# Patient Record
Sex: Male | Born: 1970 | Race: White | Hispanic: No | Marital: Single | Smoking: Never smoker
Health system: Southern US, Community
[De-identification: ages and names within clinical notes are randomized; demographics above are authoritative.]

## PROBLEM LIST (undated history)

## (undated) DIAGNOSIS — S069XAA Unspecified intracranial injury with loss of consciousness status unknown, initial encounter: Secondary | ICD-10-CM

## (undated) DIAGNOSIS — E785 Hyperlipidemia, unspecified: Secondary | ICD-10-CM

## (undated) DIAGNOSIS — S069X9A Unspecified intracranial injury with loss of consciousness of unspecified duration, initial encounter: Secondary | ICD-10-CM

## (undated) HISTORY — PX: GASTROSTOMY TUBE REVISION: SHX1704

## (undated) HISTORY — PX: GASTROSTOMY TUBE PLACEMENT: SHX655

## (undated) HISTORY — PX: BRAIN SURGERY: SHX531

## (undated) HISTORY — PX: WRIST SURGERY: SHX841

## (undated) HISTORY — PX: OTHER SURGICAL HISTORY: SHX169

## (undated) HISTORY — DX: Hyperlipidemia, unspecified: E78.5

---

## 1997-07-06 ENCOUNTER — Inpatient Hospital Stay (HOSPITAL_COMMUNITY): Admission: EM | Admit: 1997-07-06 | Discharge: 1997-08-14 | Payer: Self-pay | Admitting: Emergency Medicine

## 1997-08-14 ENCOUNTER — Inpatient Hospital Stay
Admission: RE | Admit: 1997-08-14 | Discharge: 1997-08-30 | Payer: Self-pay | Admitting: Physical Medicine and Rehabilitation

## 1997-08-30 ENCOUNTER — Inpatient Hospital Stay (HOSPITAL_COMMUNITY)
Admission: RE | Admit: 1997-08-30 | Discharge: 1997-09-27 | Payer: Self-pay | Admitting: Physical Medicine and Rehabilitation

## 1997-09-29 ENCOUNTER — Encounter: Admission: RE | Admit: 1997-09-29 | Discharge: 1997-12-28 | Payer: Self-pay

## 1997-10-18 ENCOUNTER — Encounter: Admission: RE | Admit: 1997-10-18 | Discharge: 1998-01-16 | Payer: Self-pay

## 1998-02-06 ENCOUNTER — Encounter: Admission: RE | Admit: 1998-02-06 | Discharge: 1998-05-07 | Payer: Self-pay

## 2012-04-27 ENCOUNTER — Emergency Department (HOSPITAL_COMMUNITY): Payer: Worker's Compensation

## 2012-04-27 ENCOUNTER — Emergency Department (HOSPITAL_COMMUNITY)
Admission: EM | Admit: 2012-04-27 | Discharge: 2012-04-27 | Disposition: A | Discharge status: Home / Self Care | Payer: Worker's Compensation | Attending: Emergency Medicine | Admitting: Emergency Medicine

## 2012-04-27 ENCOUNTER — Encounter (HOSPITAL_COMMUNITY): Payer: Self-pay | Admitting: Emergency Medicine

## 2012-04-27 DIAGNOSIS — S069XAS Unspecified intracranial injury with loss of consciousness status unknown, sequela: Secondary | ICD-10-CM | POA: Insufficient documentation

## 2012-04-27 DIAGNOSIS — Z87820 Personal history of traumatic brain injury: Secondary | ICD-10-CM | POA: Insufficient documentation

## 2012-04-27 DIAGNOSIS — R51 Headache: Secondary | ICD-10-CM | POA: Insufficient documentation

## 2012-04-27 DIAGNOSIS — H539 Unspecified visual disturbance: Secondary | ICD-10-CM | POA: Insufficient documentation

## 2012-04-27 DIAGNOSIS — S069X9S Unspecified intracranial injury with loss of consciousness of unspecified duration, sequela: Secondary | ICD-10-CM | POA: Insufficient documentation

## 2012-04-27 DIAGNOSIS — S0990XA Unspecified injury of head, initial encounter: Secondary | ICD-10-CM

## 2012-04-27 DIAGNOSIS — Y929 Unspecified place or not applicable: Secondary | ICD-10-CM | POA: Insufficient documentation

## 2012-04-27 DIAGNOSIS — Y9389 Activity, other specified: Secondary | ICD-10-CM | POA: Insufficient documentation

## 2012-04-27 DIAGNOSIS — M549 Dorsalgia, unspecified: Secondary | ICD-10-CM | POA: Insufficient documentation

## 2012-04-27 HISTORY — DX: Unspecified intracranial injury with loss of consciousness status unknown, initial encounter: S06.9XAA

## 2012-04-27 HISTORY — DX: Unspecified intracranial injury with loss of consciousness of unspecified duration, initial encounter: S06.9X9A

## 2012-04-27 NOTE — ED Notes (Signed)
States that he fell off the tailgate of a truck at work. States that his low back is somewhat sore. Denies neck pain or LOC.
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
## 2012-04-28 NOTE — ED Provider Notes (Signed)
History/physical exam/procedure(s) were performed by non-physician practitioner and as supervising physician I was immediately available for consultation/collaboration. I have reviewed all notes and am in agreement with care and plan. I personally performed the services described in this documentation, which was scribed in my presence. The recorded information has been reviewed and considered.   Hilario Quarry, MD 04/28/12 1520

## 2014-06-08 ENCOUNTER — Ambulatory Visit
Admission: RE | Admit: 2014-06-08 | Discharge: 2014-06-08 | Disposition: A | Discharge status: Home / Self Care | Payer: BLUE CROSS/BLUE SHIELD | Source: Ambulatory Visit | Attending: Physician Assistant | Admitting: Physician Assistant

## 2014-06-08 ENCOUNTER — Other Ambulatory Visit: Payer: Self-pay | Admitting: Physician Assistant

## 2014-06-08 DIAGNOSIS — R05 Cough: Secondary | ICD-10-CM

## 2014-06-08 DIAGNOSIS — R509 Fever, unspecified: Secondary | ICD-10-CM

## 2014-06-08 DIAGNOSIS — R059 Cough, unspecified: Secondary | ICD-10-CM

## 2014-10-06 IMAGING — CT CT HEAD W/O CM
2 series · 17 of 30 positions shown, 20 images · non-contrast
Comparison: None.

CLINICAL DATA: Fall

CT HEAD WITHOUT CONTRAST
TECHNIQUE: Contiguous axial images were obtained from the base of
the skull through the vertex without contrast.

[Series 2: head w/o · axial · non-contrast · 0.46mm/px · z∈[-125,-5]mm · 9 of 32 slices shown, 12 images]
[im 4/32  brain]
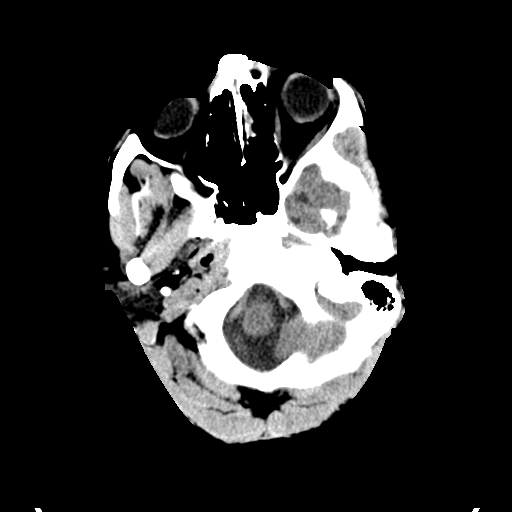
[im 4/32  bone]
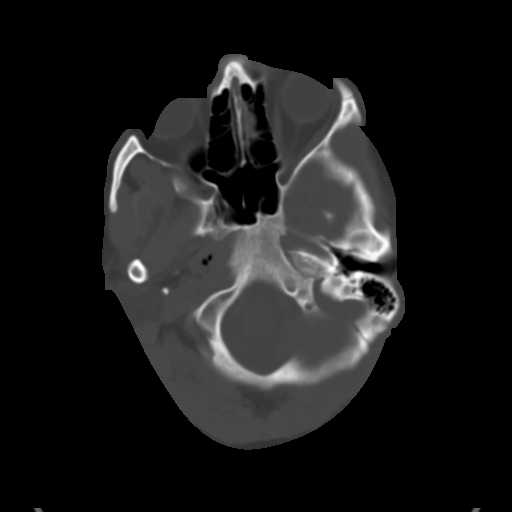
[im 7/32  brain]
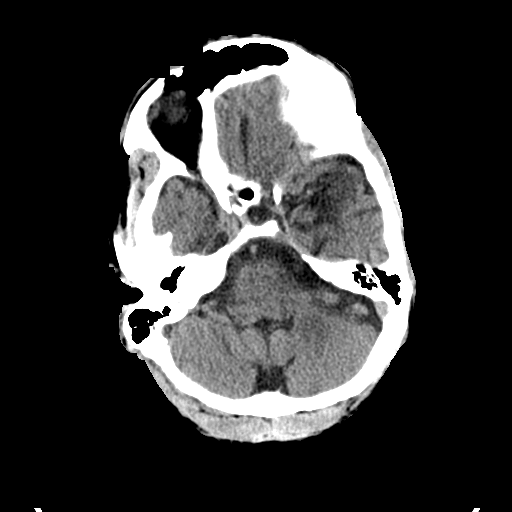
[im 10/32  brain]
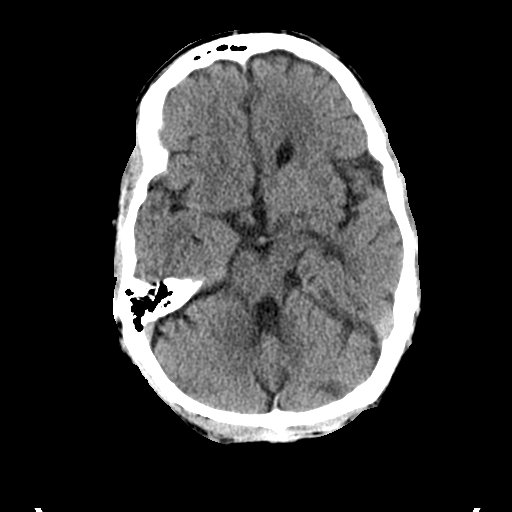
[im 13/32  brain]
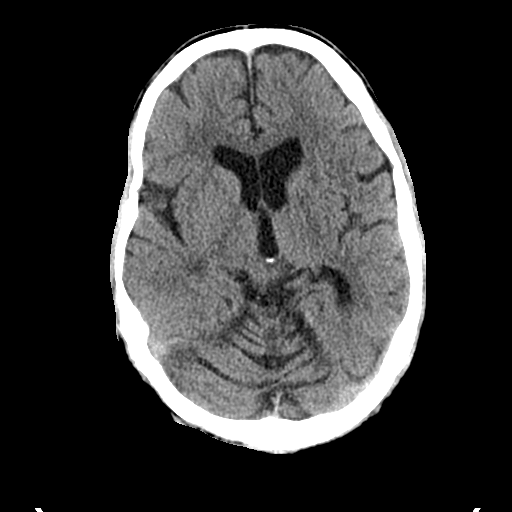
[im 16/32  brain]
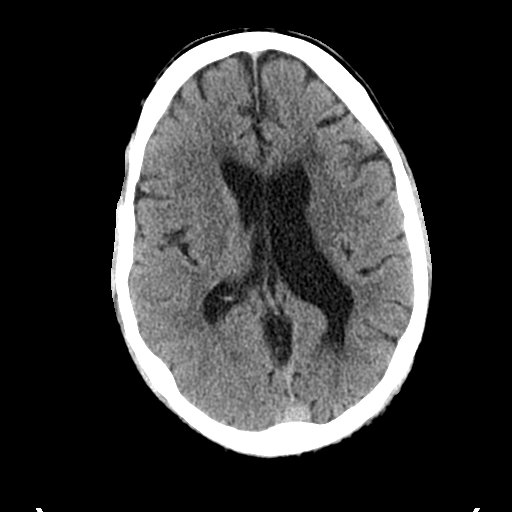
[im 16/32  bone]
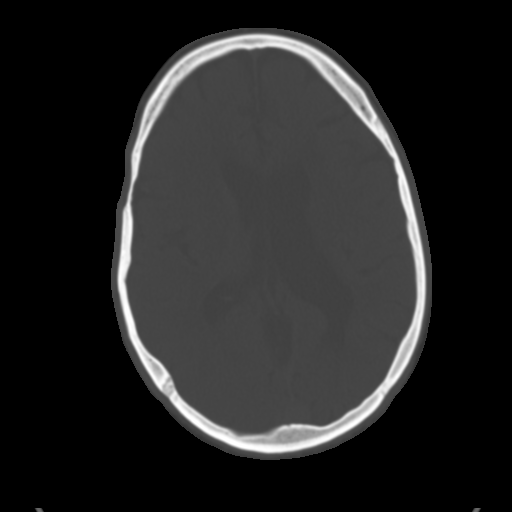
[im 19/32  brain]
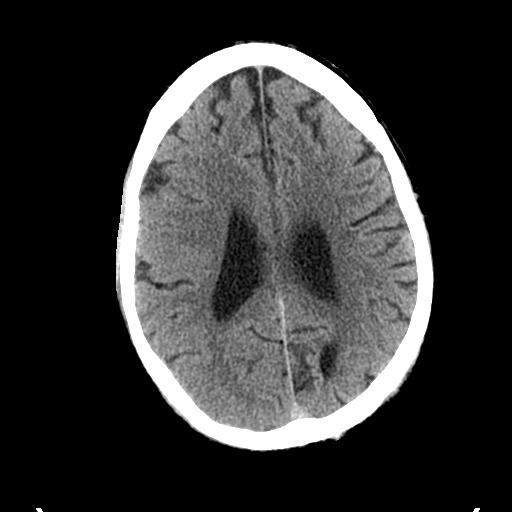
[im 22/32  brain]
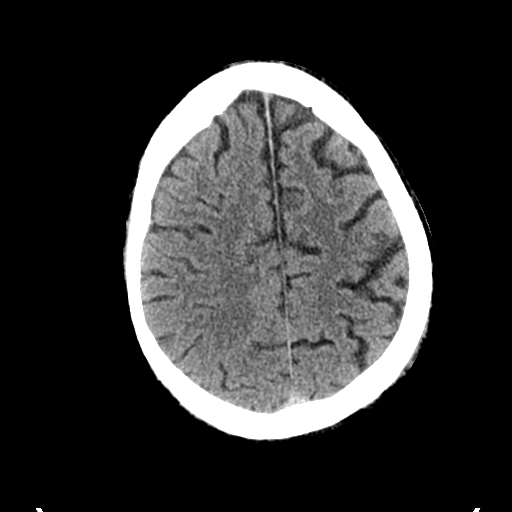
[im 25/32  brain]
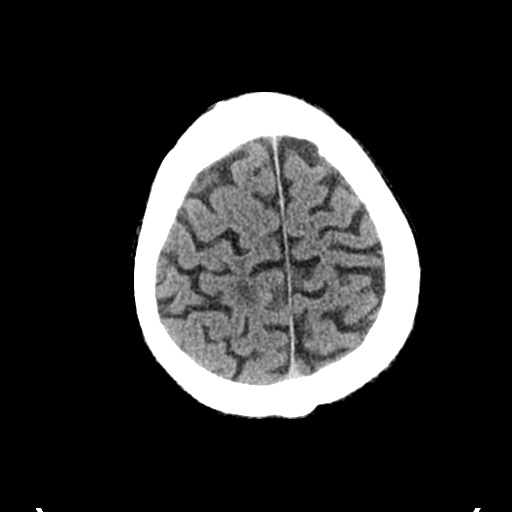
[im 28/32  brain]
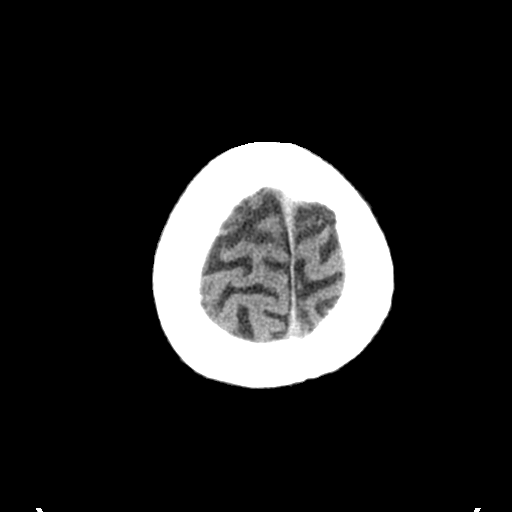
[im 28/32  bone]
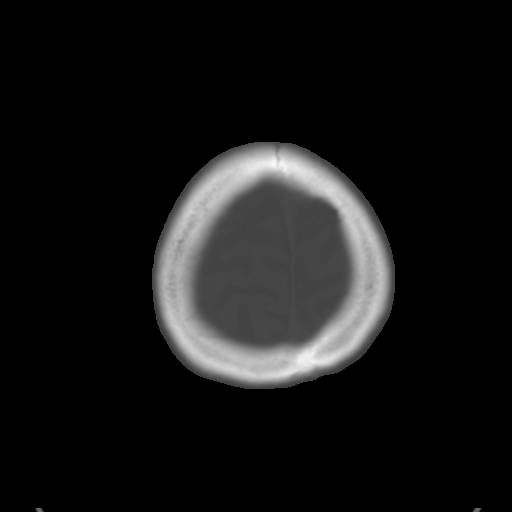

[Series 3: bone windows · axial · 0.46mm/px · z∈[-125,-5]mm · 8 of 52 slices shown]
[im 6/52  bone]
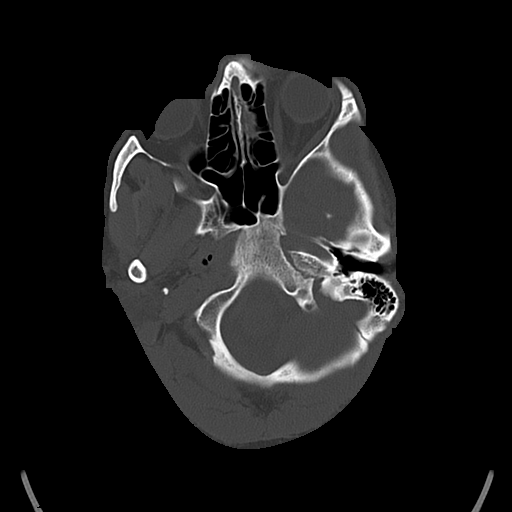
[im 12/52  bone]
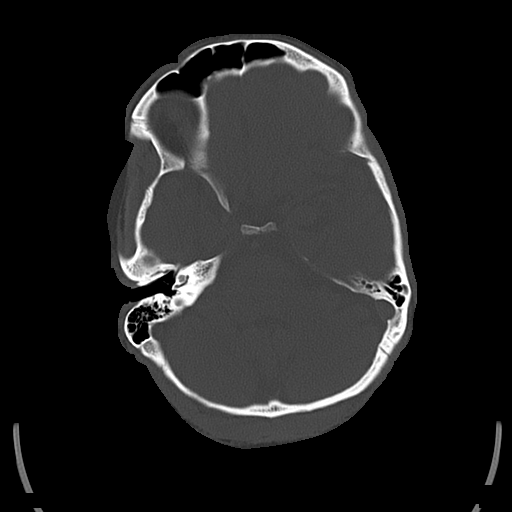
[im 18/52  bone]
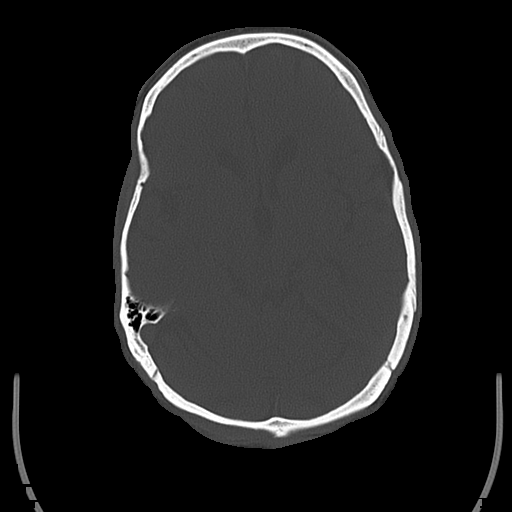
[im 23/52  bone]
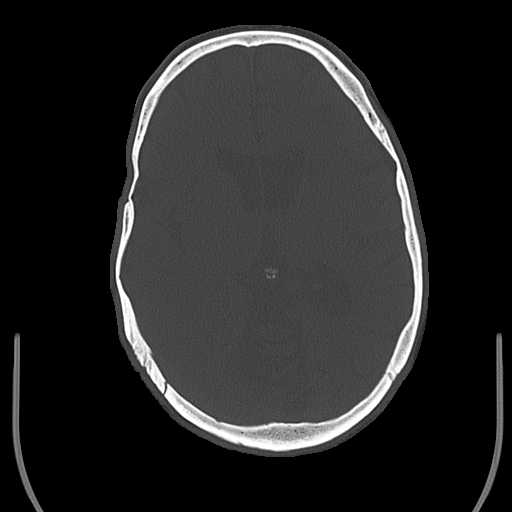
[im 29/52  bone]
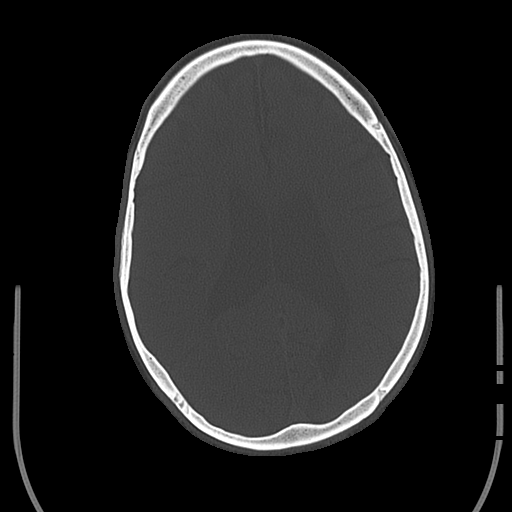
[im 35/52  bone]
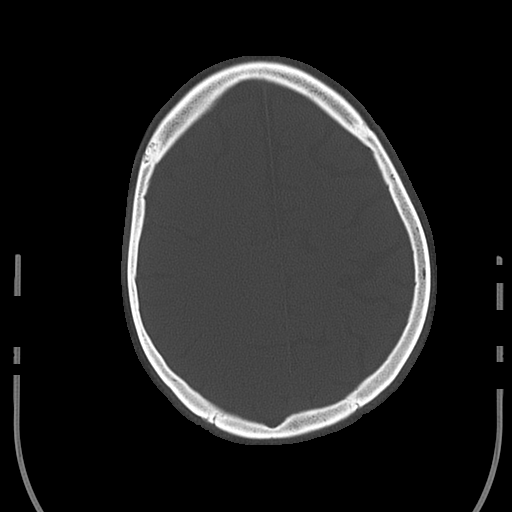
[im 40/52  bone]
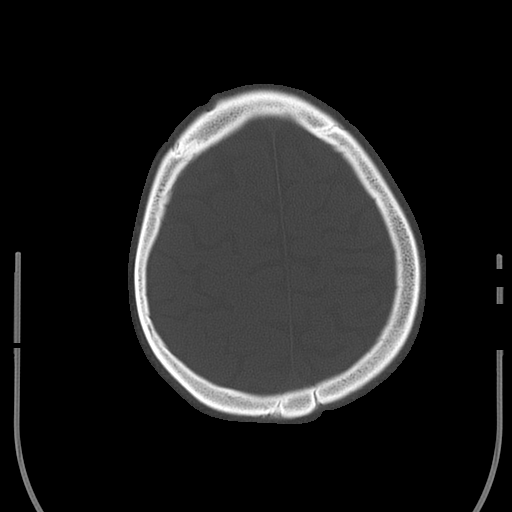
[im 46/52  bone]
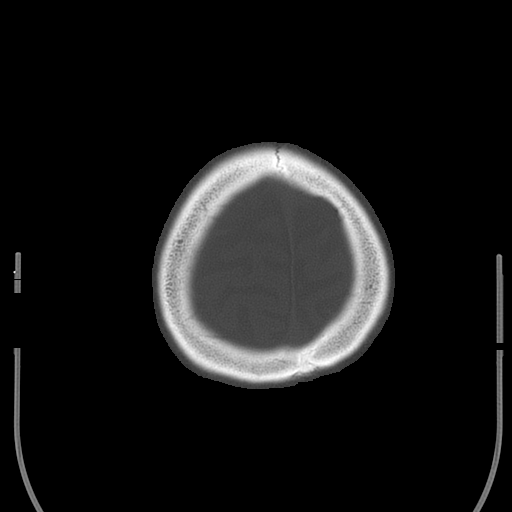

[17 of 30 positions shown; findings below may reference images not displayed]

FINDINGS: There is no mass effect, midline shift, or acute
intracranial hemorrhage.  Hypodensity in the periventricular white
matter of the frontal lobes is present.  Frontal lobe atrophy is
noted. Encephalomalacia in the anterior left upper lobe is likely
related to post-traumatic changes.  Cerebellar atrophy is present.
There is white matter atrophy in the left occipital lobe compared
with that of the right.
IMPRESSION: No acute intracranial pathology.  Chronic changes are noted.

## 2015-10-24 DIAGNOSIS — Z0001 Encounter for general adult medical examination with abnormal findings: Secondary | ICD-10-CM | POA: Diagnosis not present

## 2015-10-24 DIAGNOSIS — E7800 Pure hypercholesterolemia, unspecified: Secondary | ICD-10-CM | POA: Diagnosis not present

## 2016-01-01 DIAGNOSIS — E7800 Pure hypercholesterolemia, unspecified: Secondary | ICD-10-CM | POA: Diagnosis not present

## 2016-03-28 DIAGNOSIS — R509 Fever, unspecified: Secondary | ICD-10-CM | POA: Diagnosis not present

## 2016-10-24 DIAGNOSIS — Z0000 Encounter for general adult medical examination without abnormal findings: Secondary | ICD-10-CM | POA: Diagnosis not present

## 2016-10-24 DIAGNOSIS — E7800 Pure hypercholesterolemia, unspecified: Secondary | ICD-10-CM | POA: Diagnosis not present

## 2016-11-16 IMAGING — CR DG CHEST 2V
2 series · 2 of 2 positions shown · non-contrast
Comparison: None.

CLINICAL DATA: Four days of fever, cough, and chest congestion,
nonsmoker

EXAM:
CHEST  2 VIEW

[view not recorded (1 of 2)]
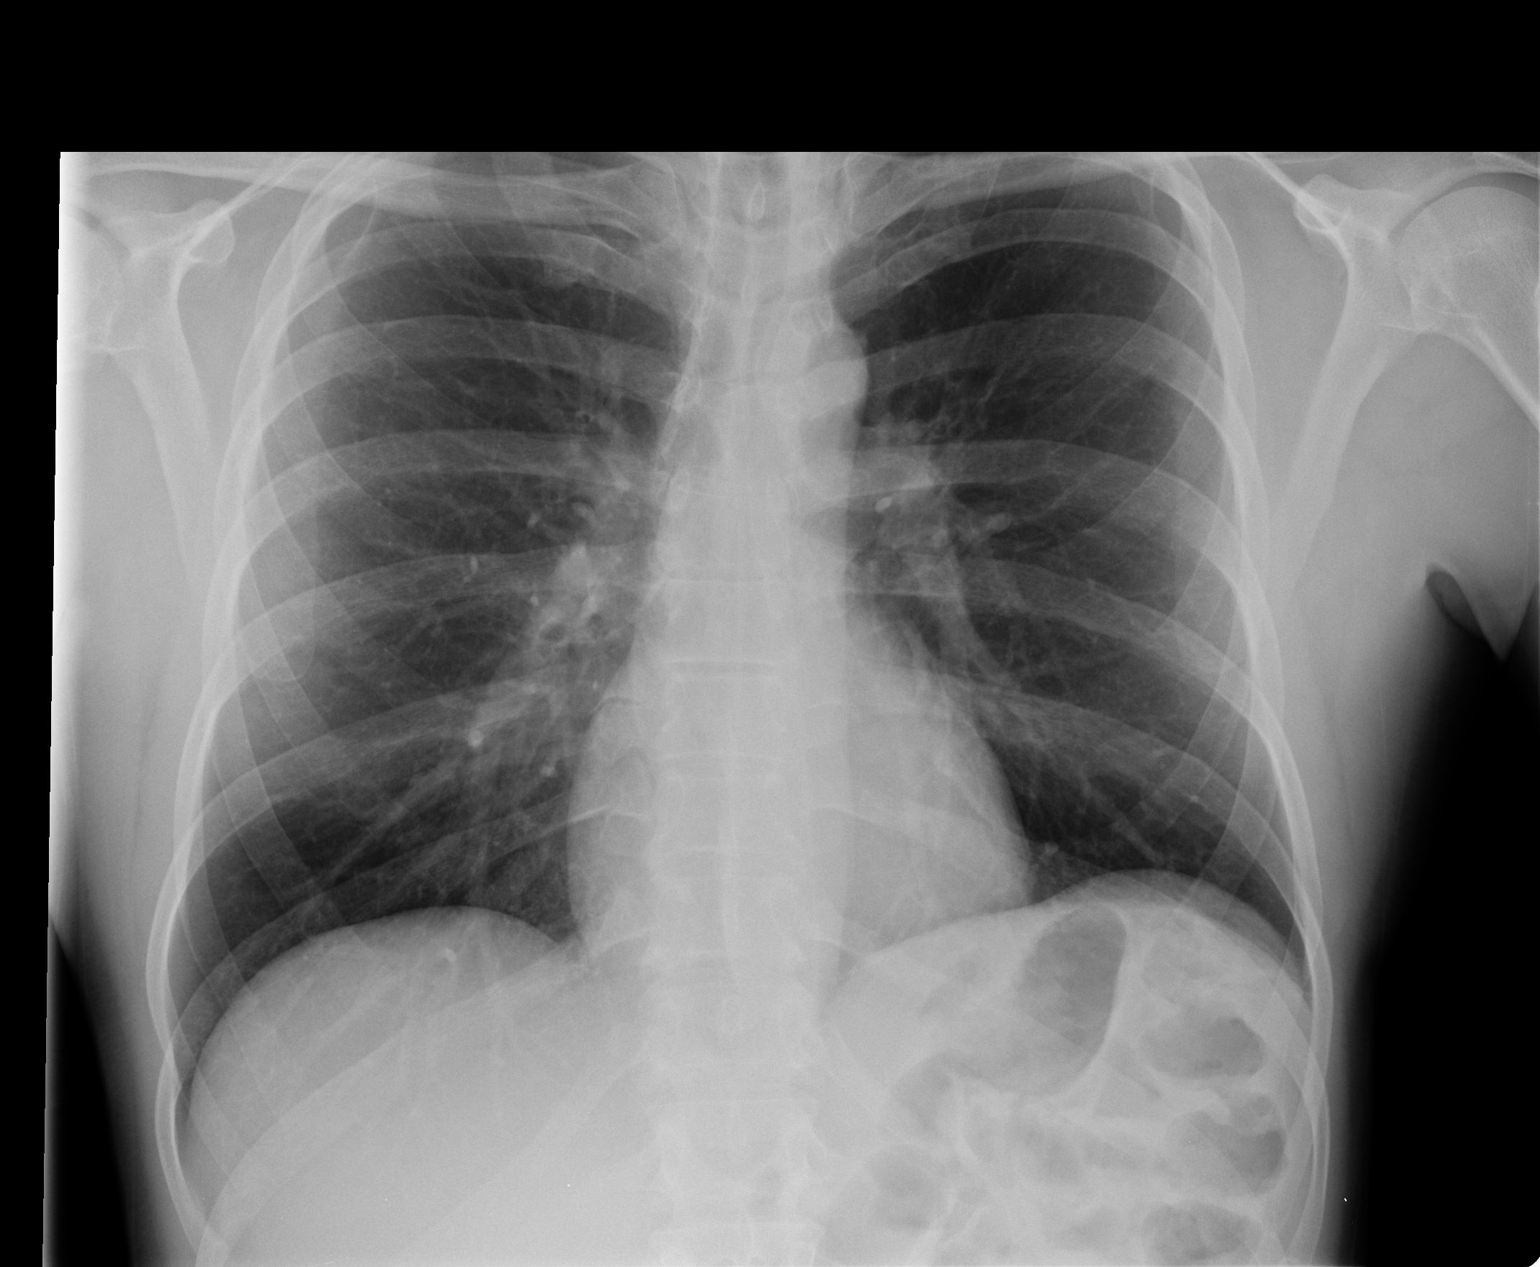

[view not recorded (2 of 2)]
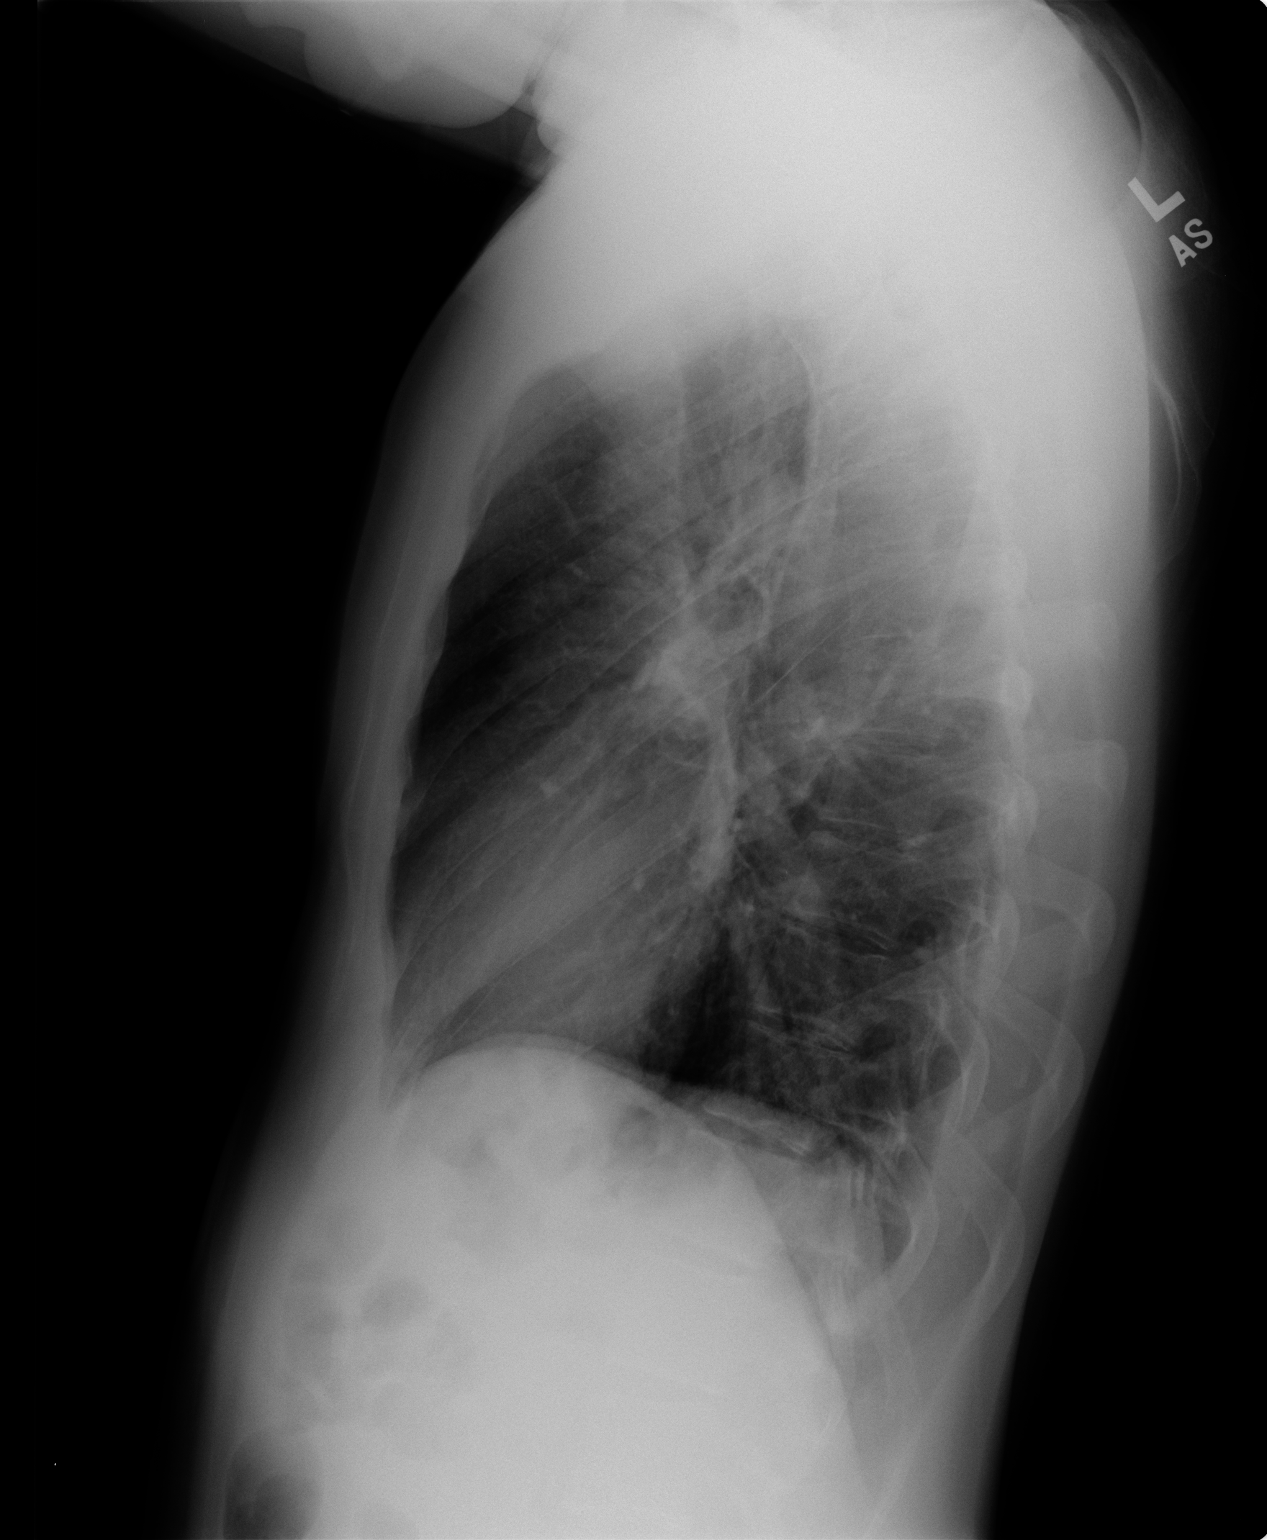

[2 of 2 positions shown; findings below may reference images not displayed]

FINDINGS: The lungs are well-expanded and clear. The heart and pulmonary
vascularity are normal. The mediastinum is normal in width. There is
no pleural effusion or pneumothorax. The bony thorax is
unremarkable.
IMPRESSION: There is no pneumonia nor other active cardiopulmonary disease.

## 2017-10-26 DIAGNOSIS — E7800 Pure hypercholesterolemia, unspecified: Secondary | ICD-10-CM | POA: Diagnosis not present

## 2017-11-16 DIAGNOSIS — L259 Unspecified contact dermatitis, unspecified cause: Secondary | ICD-10-CM | POA: Diagnosis not present

## 2017-11-16 DIAGNOSIS — Z23 Encounter for immunization: Secondary | ICD-10-CM | POA: Diagnosis not present

## 2017-11-16 DIAGNOSIS — L550 Sunburn of first degree: Secondary | ICD-10-CM | POA: Diagnosis not present

## 2018-10-27 DIAGNOSIS — E7800 Pure hypercholesterolemia, unspecified: Secondary | ICD-10-CM | POA: Diagnosis not present

## 2018-10-28 DIAGNOSIS — E7800 Pure hypercholesterolemia, unspecified: Secondary | ICD-10-CM | POA: Diagnosis not present

## 2018-10-28 DIAGNOSIS — Z23 Encounter for immunization: Secondary | ICD-10-CM | POA: Diagnosis not present

## 2019-05-16 ENCOUNTER — Ambulatory Visit: Payer: BC Managed Care – PPO | Attending: Internal Medicine

## 2019-05-16 DIAGNOSIS — Z23 Encounter for immunization: Secondary | ICD-10-CM

## 2019-05-16 NOTE — Progress Notes (Signed)
   Covid-19 Vaccination Clinic  Name:  Troy Larson    MRN: [id number] DOB: 1970-10-20  05/16/2019  Mr. Ladnier was observed post Covid-19 immunization for 15 minutes without incident. He was provided with Vaccine Information Sheet and instruction to access the V-Safe system.   Mr. Bordelon was instructed to call 911 with any severe reactions post vaccine: Marland Kitchen Difficulty breathing  . Swelling of face and throat  . A fast heartbeat  . A bad rash all over body  . Dizziness and weakness   Immunizations Administered    Name Date Dose VIS Date Route   Pfizer COVID-19 Vaccine 05/16/2019 10:40 AM 0.3 mL 01/28/2019 Intramuscular   Manufacturer: [employer], [employer]   Lot: [id number]   NDC: [address]-1

## 2019-06-07 ENCOUNTER — Ambulatory Visit: Payer: BC Managed Care – PPO | Attending: Internal Medicine

## 2019-06-07 DIAGNOSIS — Z23 Encounter for immunization: Secondary | ICD-10-CM

## 2019-06-07 NOTE — Progress Notes (Signed)
   Covid-19 Vaccination Clinic  Name:  Troy Larson    MRN: [id number] DOB: 1970-04-23  06/07/2019  [name] was observed post Covid-19 immunization for 15 minutes without incident. He was provided with Vaccine Information Sheet and instruction to access the V-Safe system.   [name] was instructed to call 911 with any severe reactions post vaccine: Marland Kitchen Difficulty breathing  . Swelling of face and throat  . A fast heartbeat  . A bad rash all over body  . Dizziness and weakness   Immunizations Administered    Name Date Dose VIS Date Route   Pfizer COVID-19 Vaccine 06/07/2019 12:57 PM 0.3 mL 04/13/2018 Intramuscular   Manufacturer: [employer], [employer]   Lot: [id number]   NDC: [address]-6

## 2019-10-27 DIAGNOSIS — E7800 Pure hypercholesterolemia, unspecified: Secondary | ICD-10-CM | POA: Diagnosis not present

## 2019-10-27 DIAGNOSIS — Z23 Encounter for immunization: Secondary | ICD-10-CM | POA: Diagnosis not present

## 2019-12-20 DIAGNOSIS — B029 Zoster without complications: Secondary | ICD-10-CM | POA: Diagnosis not present

## 2020-02-04 ENCOUNTER — Ambulatory Visit: Payer: BC Managed Care – PPO | Attending: Internal Medicine

## 2020-02-04 DIAGNOSIS — Z23 Encounter for immunization: Secondary | ICD-10-CM

## 2020-02-04 NOTE — Progress Notes (Signed)
° °  Covid-19 Vaccination Clinic  Name:  Troy Larson    MRN: [id number] DOB: 09/09/1970  02/04/2020  Mr. Westrup was observed post Covid-19 immunization for 15 minutes without incident. He was provided with Vaccine Information Sheet and instruction to access the V-Safe system.   Mr. Jamar was instructed to call 911 with any severe reactions post vaccine:  Difficulty breathing   Swelling of face and throat   A fast heartbeat   A bad rash all over body   Dizziness and weakness   Immunizations Administered    Name Date Dose VIS Date Route   Pfizer COVID-19 Vaccine 02/04/2020 12:14 PM 0.3 mL 12/07/2019 Intramuscular   Manufacturer: [employer], [employer]   Lot: [id number]   NDC: [address]-9

## 2020-07-18 DIAGNOSIS — J069 Acute upper respiratory infection, unspecified: Secondary | ICD-10-CM | POA: Diagnosis not present

## 2020-07-18 DIAGNOSIS — L259 Unspecified contact dermatitis, unspecified cause: Secondary | ICD-10-CM | POA: Diagnosis not present

## 2020-11-02 DIAGNOSIS — U071 COVID-19: Secondary | ICD-10-CM | POA: Diagnosis not present

## 2020-11-15 DIAGNOSIS — R059 Cough, unspecified: Secondary | ICD-10-CM | POA: Diagnosis not present

## 2020-12-01 DIAGNOSIS — Z23 Encounter for immunization: Secondary | ICD-10-CM | POA: Diagnosis not present

## 2020-12-04 DIAGNOSIS — E7800 Pure hypercholesterolemia, unspecified: Secondary | ICD-10-CM | POA: Diagnosis not present

## 2020-12-04 DIAGNOSIS — Z23 Encounter for immunization: Secondary | ICD-10-CM | POA: Diagnosis not present

## 2020-12-04 DIAGNOSIS — Z0000 Encounter for general adult medical examination without abnormal findings: Secondary | ICD-10-CM | POA: Diagnosis not present

## 2021-07-31 DIAGNOSIS — B351 Tinea unguium: Secondary | ICD-10-CM | POA: Diagnosis not present

## 2021-07-31 DIAGNOSIS — B353 Tinea pedis: Secondary | ICD-10-CM | POA: Diagnosis not present

## 2021-09-06 DIAGNOSIS — B351 Tinea unguium: Secondary | ICD-10-CM | POA: Diagnosis not present

## 2021-10-08 DIAGNOSIS — B351 Tinea unguium: Secondary | ICD-10-CM | POA: Diagnosis not present

## 2021-12-04 DIAGNOSIS — Z23 Encounter for immunization: Secondary | ICD-10-CM | POA: Diagnosis not present

## 2021-12-04 DIAGNOSIS — E7800 Pure hypercholesterolemia, unspecified: Secondary | ICD-10-CM | POA: Diagnosis not present

## 2022-05-02 DIAGNOSIS — M25551 Pain in right hip: Secondary | ICD-10-CM | POA: Diagnosis not present

## 2022-05-02 DIAGNOSIS — M545 Low back pain, unspecified: Secondary | ICD-10-CM | POA: Diagnosis not present

## 2022-12-03 DIAGNOSIS — E7800 Pure hypercholesterolemia, unspecified: Secondary | ICD-10-CM | POA: Diagnosis not present

## 2022-12-03 DIAGNOSIS — Z0000 Encounter for general adult medical examination without abnormal findings: Secondary | ICD-10-CM | POA: Diagnosis not present

## 2022-12-03 DIAGNOSIS — Z23 Encounter for immunization: Secondary | ICD-10-CM | POA: Diagnosis not present

## 2022-12-03 DIAGNOSIS — Z1159 Encounter for screening for other viral diseases: Secondary | ICD-10-CM | POA: Diagnosis not present

## 2022-12-03 DIAGNOSIS — R3912 Poor urinary stream: Secondary | ICD-10-CM | POA: Diagnosis not present

## 2023-06-16 DIAGNOSIS — Z130 Encounter for screening for diseases of the blood and blood-forming organs and certain disorders involving the immune mechanism: Secondary | ICD-10-CM | POA: Diagnosis not present

## 2023-06-16 DIAGNOSIS — M25511 Pain in right shoulder: Secondary | ICD-10-CM | POA: Diagnosis not present

## 2023-06-16 DIAGNOSIS — Z23 Encounter for immunization: Secondary | ICD-10-CM | POA: Diagnosis not present

## 2023-06-16 DIAGNOSIS — R519 Headache, unspecified: Secondary | ICD-10-CM | POA: Diagnosis not present

## 2023-06-16 DIAGNOSIS — M25562 Pain in left knee: Secondary | ICD-10-CM | POA: Diagnosis not present

## 2023-06-16 DIAGNOSIS — E7800 Pure hypercholesterolemia, unspecified: Secondary | ICD-10-CM | POA: Diagnosis not present

## 2023-07-09 ENCOUNTER — Encounter: Payer: Self-pay | Admitting: Gastroenterology

## 2023-07-09 ENCOUNTER — Telehealth: Payer: Self-pay

## 2023-07-09 ENCOUNTER — Ambulatory Visit (AMBULATORY_SURGERY_CENTER)

## 2023-07-09 VITALS — Ht 68.0 in | Wt 145.0 lb

## 2023-07-09 DIAGNOSIS — Z1211 Encounter for screening for malignant neoplasm of colon: Secondary | ICD-10-CM

## 2023-07-09 MED ORDER — NA SULFATE-K SULFATE-MG SULF 17.5-3.13-1.6 GM/177ML PO SOLN: 1.0000 | Freq: Once | ORAL | 0 refills | Status: AC

## 2023-07-09 NOTE — Telephone Encounter (Signed)
Completed PV

## 2023-07-09 NOTE — Progress Notes (Signed)

## 2023-07-21 ENCOUNTER — Encounter: Payer: Self-pay | Admitting: Gastroenterology

## 2023-07-21 ENCOUNTER — Ambulatory Visit (AMBULATORY_SURGERY_CENTER): Admitting: Gastroenterology

## 2023-07-21 VITALS — BP 108/71 | HR 93 | Temp 98.1°F | Resp 19 | Ht 68.0 in | Wt 145.0 lb

## 2023-07-21 DIAGNOSIS — K562 Volvulus: Secondary | ICD-10-CM

## 2023-07-21 DIAGNOSIS — Z1211 Encounter for screening for malignant neoplasm of colon: Secondary | ICD-10-CM | POA: Diagnosis not present

## 2023-07-21 DIAGNOSIS — K641 Second degree hemorrhoids: Secondary | ICD-10-CM | POA: Diagnosis not present

## 2023-07-21 MED ORDER — SODIUM CHLORIDE 0.9 % IV SOLN: 500.0000 mL | Freq: Once | INTRAVENOUS | Status: DC

## 2023-07-21 NOTE — Progress Notes (Signed)
 GASTROENTEROLOGY PROCEDURE H&P NOTE   Primary Care Physician: Benedetto Brady, MD  HPI: Troy Larson is a 53 y.o. male who presents for Colonoscopy for screening.  Past Medical History:  Diagnosis Date   Brain injury (HCC)    Hyperlipidemia    Past Surgical History:  Procedure Laterality Date   BRAIN SURGERY     chest tubes     GASTROSTOMY TUBE PLACEMENT     GASTROSTOMY TUBE REVISION     tracheotomy     WRIST SURGERY     Current Outpatient Medications  Medication Sig Dispense Refill   acetaminophen (TYLENOL) 500 MG tablet Take 1,000 mg by mouth every 6 (six) hours as needed for pain.     atorvastatin (LIPITOR) 20 MG tablet Take 20 mg by mouth daily.     ibuprofen (ADVIL,MOTRIN) 200 MG tablet Take 200 mg by mouth every 6 (six) hours as needed for pain.     No current facility-administered medications for this visit.    Current Outpatient Medications:    acetaminophen (TYLENOL) 500 MG tablet, Take 1,000 mg by mouth every 6 (six) hours as needed for pain., Disp: , Rfl:    atorvastatin (LIPITOR) 20 MG tablet, Take 20 mg by mouth daily., Disp: , Rfl:    ibuprofen (ADVIL,MOTRIN) 200 MG tablet, Take 200 mg by mouth every 6 (six) hours as needed for pain., Disp: , Rfl:  No Known Allergies Family History  Problem Relation Age of Onset   Colon polyps Father    Esophageal cancer Maternal Grandfather    Colon polyps Paternal Grandmother    Colon cancer Paternal Grandmother    Rectal cancer Neg Hx    Stomach cancer Neg Hx    Social History   Socioeconomic History   Marital status: Single    Spouse name: Not on file   Number of children: Not on file   Years of education: Not on file   Highest education level: Not on file  Occupational History   Not on file  Tobacco Use   Smoking status: Never   Smokeless tobacco: Not on file  Substance and Sexual Activity   Alcohol use: No   Drug use: No   Sexual activity: Not on file  Other Topics Concern   Not on file  Social  History Narrative   Not on file   Social Drivers of Health   Financial Resource Strain: Not on file  Food Insecurity: Not on file  Transportation Needs: Not on file  Physical Activity: Not on file  Stress: Not on file  Social Connections: Not on file  Intimate Partner Violence: Not on file    Physical Exam: There were no vitals filed for this visit. There is no height or weight on file to calculate BMI. GEN: NAD EYE: Sclerae anicteric ENT: MMM CV: Non-tachycardic GI: Soft, NT/ND NEURO:  Alert & Oriented x 3  Lab Results: No results for input(s): "WBC", "HGB", "HCT", "PLT" in the last 72 hours. BMET No results for input(s): "NA", "K", "CL", "CO2", "GLUCOSE", "BUN", "CREATININE", "CALCIUM" in the last 72 hours. LFT No results for input(s): "PROT", "ALBUMIN", "AST", "ALT", "ALKPHOS", "BILITOT", "BILIDIR", "IBILI" in the last 72 hours. PT/INR No results for input(s): "LABPROT", "INR" in the last 72 hours.   Impression / Plan: This is a 52 y.o.male who presents for Colonoscopy for screening.  The risks and benefits of endoscopic evaluation/treatment were discussed with the patient and/or family; these include but are not limited to the risk  of perforation, infection, bleeding, missed lesions, lack of diagnosis, severe illness requiring hospitalization, as well as anesthesia and sedation related illnesses.  The patient's history has been reviewed, patient examined, no change in status, and deemed stable for procedure.  The patient and/or family is agreeable to proceed.    Yong Henle, MD [facility] Office # [phone]

## 2023-07-21 NOTE — Op Note (Signed)
 [facility] Patient Name: Troy Larson Procedure Date: 07/21/2023 11:12 AM MRN: [id number] Endoscopist: Yong Henle , MD, [id number] Age: 53 Referring MD:  Date of Birth: [birth year] Gender: Male Account #: [id number] Procedure:                Colonoscopy Indications:              Screening for colorectal malignant neoplasm, This                            is the patient's first colonoscopy Medicines:                Monitored Anesthesia Care Procedure:                Pre-Anesthesia Assessment:                           - Prior to the procedure, a History and Physical                            was performed, and patient medications and                            allergies were reviewed. The patient's tolerance of                            previous anesthesia was also reviewed. The risks                            and benefits of the procedure and the sedation                            options and risks were discussed with the patient.                            All questions were answered, and informed consent                            was obtained. Prior Anticoagulants: The patient has                            taken no anticoagulant or antiplatelet agents                            except for NSAID medication. ASA Grade Assessment:                            II - A patient with mild systemic disease. After                            reviewing the risks and benefits, the patient was                            deemed in satisfactory condition to undergo the  procedure.                           After obtaining informed consent, the colonoscope                            was passed under direct vision. Throughout the                            procedure, the patient's blood pressure, pulse, and                            oxygen saturations were monitored continuously. The                            CF [id number] #[id number] was introduced through  the anus                            and advanced to the the cecum, identified by                            appendiceal orifice and ileocecal valve. The                            colonoscopy was performed without difficulty. The                            patient tolerated the procedure. The quality of the                            bowel preparation was good. The ileocecal valve,                            appendiceal orifice, and rectum were photographed. Scope In: 11:21:43 AM Scope Out: 11:32:14 AM Scope Withdrawal Time: 0 hours 6 minutes 33 seconds  Total Procedure Duration: 0 hours 10 minutes 31 seconds  Findings:                 The digital rectal exam findings include                            hemorrhoids. Pertinent negatives include no                            palpable rectal lesions.                           The colon (entire examined portion) revealed                            significantly excessive looping.                           Normal mucosa was found in the entire colon.  Non-bleeding non-thrombosed internal hemorrhoids                            were found during retroflexion, during perianal                            exam and during digital exam. The hemorrhoids were                            Grade II (internal hemorrhoids that prolapse but                            reduce spontaneously). Complications:            No immediate complications. Estimated Blood Loss:     Estimated blood loss: none. Impression:               - Hemorrhoids found on digital rectal exam.                           - There was significant looping of the colon.                           - Normal mucosa in the entire examined colon.                           - Non-bleeding non-thrombosed internal hemorrhoids.                           - No specimens collected. Recommendation:           - The patient will be observed post-procedure,                             until all discharge criteria are met.                           - Discharge patient to home.                           - Patient has a contact number available for                            emergencies. The signs and symptoms of potential                            delayed complications were discussed with the                            patient. Return to normal activities tomorrow.                            Written discharge instructions were provided to the                            patient.                           -  High fiber diet.                           - Use FiberCon 1-2 tablets PO daily.                           - Continue present medications.                           - Repeat colonoscopy in 10 years for screening                            purposes.                           - The findings and recommendations were discussed                            with the patient.                           - The findings and recommendations were discussed                            with the patient's family. Yong Henle, MD 07/21/2023 11:36:17 AM

## 2023-07-21 NOTE — Progress Notes (Signed)
 Vss nad trans to pacu

## 2023-07-21 NOTE — Patient Instructions (Signed)
 Handout given on high fiber diet.  YOU HAD AN ENDOSCOPIC PROCEDURE TODAY AT THE [facility]:   Refer to the procedure report that was given to you for any specific questions about what was found during the examination.  If the procedure report does not answer your questions, please call your gastroenterologist to clarify.  If you requested that your care partner not be given the details of your procedure findings, then the procedure report has been included in a sealed envelope for you to review at your convenience later.  YOU SHOULD EXPECT: Some feelings of bloating in the abdomen. Passage of more gas than usual.  Walking can help get rid of the air that was put into your GI tract during the procedure and reduce the bloating. If you had a lower endoscopy (such as a colonoscopy or flexible sigmoidoscopy) you may notice spotting of blood in your stool or on the toilet paper. If you underwent a bowel prep for your procedure, you may not have a normal bowel movement for a few days.  Please Note:  You might notice some irritation and congestion in your nose or some drainage.  This is from the oxygen used during your procedure.  There is no need for concern and it should clear up in a day or so.  SYMPTOMS TO REPORT IMMEDIATELY:  Following lower endoscopy (colonoscopy or flexible sigmoidoscopy):  Excessive amounts of blood in the stool  Significant tenderness or worsening of abdominal pains  Swelling of the abdomen that is new, acute  Fever of 100F or higher   For urgent or emergent issues, a gastroenterologist can be reached at any hour by calling [phone]. Do not use MyChart messaging for urgent concerns.    DIET:  We do recommend a small meal at first, but then you may proceed to your regular diet.  Drink plenty of fluids but you should avoid alcoholic beverages for 24 hours.  ACTIVITY:  You should plan to take it easy for the rest of today and you should NOT DRIVE or use  heavy machinery until tomorrow (because of the sedation medicines used during the test).    FOLLOW UP: Our staff will call the number listed on your records the next business day following your procedure.  We will call around 7:15- 8:00 am to check on you and address any questions or concerns that you may have regarding the information given to you following your procedure. If we do not reach you, we will leave a message.     If any biopsies were taken you will be contacted by phone or by letter within the next 1-3 weeks.  Please call us  at [phone] if you have not heard about the biopsies in 3 weeks.    SIGNATURES/CONFIDENTIALITY: You and/or your care partner have signed paperwork which will be entered into your electronic medical record.  These signatures attest to the fact that that the information above on your After Visit Summary has been reviewed and is understood.  Full responsibility of the confidentiality of this discharge information lies with you and/or your care-partner.

## 2023-07-22 ENCOUNTER — Telehealth: Payer: Self-pay | Admitting: *Deleted

## 2023-07-22 NOTE — Telephone Encounter (Signed)
  Follow up Call-     07/21/2023   10:42 AM  Call back number  Post procedure Call Back phone  # [phone]  Permission to leave phone message Yes     Patient questions:  Do you have a fever, pain , or abdominal swelling? No. Pain Score  0 *  Have you tolerated food without any problems? Yes.    Have you been able to return to your normal activities? Yes.    Do you have any questions about your discharge instructions: Diet   No. Medications  No. Follow up visit  No.  Do you have questions or concerns about your Care? No.  Actions: * If pain score is 4 or above: No action needed, pain <4.

## 2023-12-08 DIAGNOSIS — E7800 Pure hypercholesterolemia, unspecified: Secondary | ICD-10-CM | POA: Diagnosis not present

## 2023-12-08 DIAGNOSIS — Z23 Encounter for immunization: Secondary | ICD-10-CM | POA: Diagnosis not present

## 2023-12-08 DIAGNOSIS — Z130 Encounter for screening for diseases of the blood and blood-forming organs and certain disorders involving the immune mechanism: Secondary | ICD-10-CM | POA: Diagnosis not present

## 2023-12-08 DIAGNOSIS — Z0000 Encounter for general adult medical examination without abnormal findings: Secondary | ICD-10-CM | POA: Diagnosis not present
